# Patient Record
Sex: Female | Born: 1975 | Race: White | Hispanic: No | Marital: Married | Smoking: Never smoker
Health system: Southern US, Community
[De-identification: ages and names within clinical notes are randomized; demographics above are authoritative.]

## PROBLEM LIST (undated history)

## (undated) DIAGNOSIS — E119 Type 2 diabetes mellitus without complications: Secondary | ICD-10-CM

## (undated) DIAGNOSIS — N6310 Unspecified lump in the right breast, unspecified quadrant: Secondary | ICD-10-CM

## (undated) HISTORY — PX: SPLENECTOMY: SUR1306

---

## 2006-09-28 ENCOUNTER — Other Ambulatory Visit: Admission: RE | Admit: 2006-09-28 | Discharge: 2006-09-28 | Payer: Self-pay | Admitting: Obstetrics & Gynecology

## 2006-10-03 ENCOUNTER — Encounter: Admission: RE | Admit: 2006-10-03 | Discharge: 2006-10-03 | Payer: Self-pay | Admitting: Obstetrics & Gynecology

## 2014-02-12 ENCOUNTER — Other Ambulatory Visit (HOSPITAL_COMMUNITY)
Admission: RE | Admit: 2014-02-12 | Discharge: 2014-02-12 | Disposition: A | Discharge status: Home / Self Care | Payer: BC Managed Care – PPO | Source: Ambulatory Visit | Attending: Obstetrics & Gynecology | Admitting: Obstetrics & Gynecology

## 2014-02-12 ENCOUNTER — Other Ambulatory Visit: Payer: Self-pay | Admitting: Obstetrics & Gynecology

## 2014-02-12 DIAGNOSIS — Z124 Encounter for screening for malignant neoplasm of cervix: Secondary | ICD-10-CM | POA: Insufficient documentation

## 2014-02-12 DIAGNOSIS — Z1151 Encounter for screening for human papillomavirus (HPV): Secondary | ICD-10-CM | POA: Insufficient documentation

## 2016-06-02 ENCOUNTER — Other Ambulatory Visit (HOSPITAL_COMMUNITY)
Admission: RE | Admit: 2016-06-02 | Discharge: 2016-06-02 | Disposition: A | Discharge status: Home / Self Care | Payer: BC Managed Care – PPO | Source: Ambulatory Visit | Attending: Obstetrics and Gynecology | Admitting: Obstetrics and Gynecology

## 2016-06-02 ENCOUNTER — Other Ambulatory Visit: Payer: Self-pay | Admitting: Obstetrics & Gynecology

## 2016-06-02 DIAGNOSIS — Z01411 Encounter for gynecological examination (general) (routine) with abnormal findings: Secondary | ICD-10-CM | POA: Diagnosis present

## 2016-06-03 LAB — CYTOLOGY - PAP

## 2016-10-31 HISTORY — PX: BREAST EXCISIONAL BIOPSY: SUR124

## 2017-04-10 ENCOUNTER — Other Ambulatory Visit: Payer: Self-pay | Admitting: Obstetrics & Gynecology

## 2017-04-10 DIAGNOSIS — N63 Unspecified lump in unspecified breast: Secondary | ICD-10-CM

## 2017-05-18 ENCOUNTER — Ambulatory Visit
Admission: RE | Admit: 2017-05-18 | Discharge: 2017-05-18 | Disposition: A | Discharge status: Home / Self Care | Payer: BC Managed Care – PPO | Source: Ambulatory Visit | Attending: Obstetrics & Gynecology | Admitting: Obstetrics & Gynecology

## 2017-05-18 ENCOUNTER — Other Ambulatory Visit: Payer: Self-pay | Admitting: Obstetrics & Gynecology

## 2017-05-18 DIAGNOSIS — N63 Unspecified lump in unspecified breast: Secondary | ICD-10-CM

## 2017-05-22 ENCOUNTER — Other Ambulatory Visit: Payer: Self-pay | Admitting: Obstetrics & Gynecology

## 2017-05-22 DIAGNOSIS — N63 Unspecified lump in unspecified breast: Secondary | ICD-10-CM

## 2017-05-23 ENCOUNTER — Ambulatory Visit
Admission: RE | Admit: 2017-05-23 | Discharge: 2017-05-23 | Disposition: A | Discharge status: Home / Self Care | Payer: BC Managed Care – PPO | Source: Ambulatory Visit | Attending: Obstetrics & Gynecology | Admitting: Obstetrics & Gynecology

## 2017-05-23 DIAGNOSIS — N63 Unspecified lump in unspecified breast: Secondary | ICD-10-CM

## 2017-06-20 ENCOUNTER — Ambulatory Visit: Payer: Self-pay | Admitting: General Surgery

## 2017-06-20 DIAGNOSIS — N6021 Fibroadenosis of right breast: Secondary | ICD-10-CM

## 2017-07-10 ENCOUNTER — Ambulatory Visit: Payer: Self-pay | Admitting: General Surgery

## 2017-07-10 DIAGNOSIS — N6021 Fibroadenosis of right breast: Secondary | ICD-10-CM

## 2017-07-17 ENCOUNTER — Other Ambulatory Visit: Payer: Self-pay | Admitting: General Surgery

## 2017-07-17 DIAGNOSIS — N6021 Fibroadenosis of right breast: Secondary | ICD-10-CM

## 2017-09-12 ENCOUNTER — Encounter (HOSPITAL_BASED_OUTPATIENT_CLINIC_OR_DEPARTMENT_OTHER): Payer: Self-pay | Admitting: *Deleted

## 2017-09-12 ENCOUNTER — Other Ambulatory Visit: Payer: Self-pay

## 2017-09-13 ENCOUNTER — Encounter (HOSPITAL_BASED_OUTPATIENT_CLINIC_OR_DEPARTMENT_OTHER)
Admission: RE | Admit: 2017-09-13 | Discharge: 2017-09-13 | Disposition: A | Discharge status: Home / Self Care | Payer: BC Managed Care – PPO | Source: Ambulatory Visit | Attending: General Surgery | Admitting: General Surgery

## 2017-09-13 DIAGNOSIS — Z01818 Encounter for other preprocedural examination: Secondary | ICD-10-CM | POA: Insufficient documentation

## 2017-09-13 LAB — BASIC METABOLIC PANEL
ANION GAP: 7 (ref 5–15)
BUN: 10 mg/dL (ref 6–20)
CALCIUM: 9 mg/dL (ref 8.9–10.3)
CO2: 24 mmol/L (ref 22–32)
Chloride: 103 mmol/L (ref 101–111)
Creatinine, Ser: 0.79 mg/dL (ref 0.44–1.00)
GFR calc non Af Amer: >60 mL/min (ref >60)
Glucose, Bld: 234 mg/dL — ABNORMAL HIGH (ref 65–99)
Potassium: 4.4 mmol/L (ref 3.5–5.1)
Sodium: 134 mmol/L — ABNORMAL LOW (ref 135–145)

## 2017-09-18 ENCOUNTER — Ambulatory Visit
Admission: RE | Admit: 2017-09-18 | Discharge: 2017-09-18 | Disposition: A | Discharge status: Home / Self Care | Payer: BC Managed Care – PPO | Source: Ambulatory Visit | Attending: General Surgery | Admitting: General Surgery

## 2017-09-18 DIAGNOSIS — N6021 Fibroadenosis of right breast: Secondary | ICD-10-CM

## 2017-09-19 ENCOUNTER — Ambulatory Visit
Admission: RE | Admit: 2017-09-19 | Discharge: 2017-09-19 | Disposition: A | Discharge status: Home / Self Care | Payer: BC Managed Care – PPO | Source: Ambulatory Visit | Attending: General Surgery | Admitting: General Surgery

## 2017-09-19 ENCOUNTER — Encounter (HOSPITAL_BASED_OUTPATIENT_CLINIC_OR_DEPARTMENT_OTHER): Payer: Self-pay | Admitting: *Deleted

## 2017-09-19 ENCOUNTER — Ambulatory Visit (HOSPITAL_BASED_OUTPATIENT_CLINIC_OR_DEPARTMENT_OTHER): Payer: BC Managed Care – PPO | Admitting: Anesthesiology

## 2017-09-19 ENCOUNTER — Encounter (HOSPITAL_BASED_OUTPATIENT_CLINIC_OR_DEPARTMENT_OTHER)
Admission: RE | Disposition: A | Discharge status: Home / Self Care | Payer: Self-pay | Source: Ambulatory Visit | Attending: General Surgery

## 2017-09-19 ENCOUNTER — Other Ambulatory Visit: Payer: Self-pay

## 2017-09-19 ENCOUNTER — Ambulatory Visit (HOSPITAL_BASED_OUTPATIENT_CLINIC_OR_DEPARTMENT_OTHER)
Admission: RE | Admit: 2017-09-19 | Discharge: 2017-09-19 | Disposition: A | Discharge status: Home / Self Care | Payer: BC Managed Care – PPO | Source: Ambulatory Visit | Attending: General Surgery | Admitting: General Surgery

## 2017-09-19 DIAGNOSIS — N6031 Fibrosclerosis of right breast: Secondary | ICD-10-CM | POA: Insufficient documentation

## 2017-09-19 DIAGNOSIS — N6091 Unspecified benign mammary dysplasia of right breast: Secondary | ICD-10-CM | POA: Insufficient documentation

## 2017-09-19 DIAGNOSIS — Z79899 Other long term (current) drug therapy: Secondary | ICD-10-CM | POA: Diagnosis not present

## 2017-09-19 DIAGNOSIS — E119 Type 2 diabetes mellitus without complications: Secondary | ICD-10-CM | POA: Diagnosis not present

## 2017-09-19 DIAGNOSIS — Z7984 Long term (current) use of oral hypoglycemic drugs: Secondary | ICD-10-CM | POA: Diagnosis not present

## 2017-09-19 DIAGNOSIS — N6021 Fibroadenosis of right breast: Secondary | ICD-10-CM

## 2017-09-19 HISTORY — PX: BREAST LUMPECTOMY WITH RADIOACTIVE SEED LOCALIZATION: SHX6424

## 2017-09-19 HISTORY — DX: Type 2 diabetes mellitus without complications: E11.9

## 2017-09-19 HISTORY — DX: Unspecified lump in the right breast, unspecified quadrant: N63.10

## 2017-09-19 LAB — GLUCOSE, CAPILLARY
GLUCOSE-CAPILLARY: 214 mg/dL — AB (ref 65–99)
Glucose-Capillary: 152 mg/dL — ABNORMAL HIGH (ref 65–99)

## 2017-09-19 SURGERY — BREAST LUMPECTOMY WITH RADIOACTIVE SEED LOCALIZATION
Anesthesia: General | Site: Breast | Laterality: Right

## 2017-09-19 MED ORDER — BUPIVACAINE HCL (PF) 0.25 % IJ SOLN
INTRAMUSCULAR | Status: AC
  Filled 2017-09-19: qty 30

## 2017-09-19 MED ORDER — PROPOFOL 10 MG/ML IV BOLUS
INTRAVENOUS | Status: DC | PRN
  Administered 2017-09-19: 180 mg via INTRAVENOUS

## 2017-09-19 MED ORDER — PHENYLEPHRINE 40 MCG/ML (10ML) SYRINGE FOR IV PUSH (FOR BLOOD PRESSURE SUPPORT)
PREFILLED_SYRINGE | INTRAVENOUS | Status: DC | PRN
  Administered 2017-09-19: 80 ug via INTRAVENOUS

## 2017-09-19 MED ORDER — CELECOXIB 400 MG PO CAPS
400.0000 mg | ORAL_CAPSULE | ORAL | Status: AC
  Administered 2017-09-19: 400 mg via ORAL

## 2017-09-19 MED ORDER — OXYCODONE HCL 5 MG PO TABS: 5.0000 mg | ORAL_TABLET | Freq: Once | ORAL | Status: DC | PRN

## 2017-09-19 MED ORDER — HYDROCODONE-ACETAMINOPHEN 5-325 MG PO TABS: 1.0000 | ORAL_TABLET | Freq: Four times a day (QID) | ORAL | 0 refills | Status: AC | PRN

## 2017-09-19 MED ORDER — GABAPENTIN 300 MG PO CAPS
300.0000 mg | ORAL_CAPSULE | ORAL | Status: AC
  Administered 2017-09-19: 300 mg via ORAL

## 2017-09-19 MED ORDER — DEXAMETHASONE SODIUM PHOSPHATE 10 MG/ML IJ SOLN
INTRAMUSCULAR | Status: DC | PRN
  Administered 2017-09-19: 10 mg via INTRAVENOUS

## 2017-09-19 MED ORDER — CHLORHEXIDINE GLUCONATE CLOTH 2 % EX PADS: 6.0000 | MEDICATED_PAD | Freq: Once | CUTANEOUS | Status: DC

## 2017-09-19 MED ORDER — GABAPENTIN 300 MG PO CAPS
ORAL_CAPSULE | ORAL | Status: AC
  Filled 2017-09-19: qty 1

## 2017-09-19 MED ORDER — MIDAZOLAM HCL 2 MG/2ML IJ SOLN
INTRAMUSCULAR | Status: AC
  Filled 2017-09-19: qty 2

## 2017-09-19 MED ORDER — PROPOFOL 10 MG/ML IV BOLUS
INTRAVENOUS | Status: AC
  Filled 2017-09-19: qty 20

## 2017-09-19 MED ORDER — LACTATED RINGERS IV SOLN
INTRAVENOUS | Status: DC
  Administered 2017-09-19 (×2): via INTRAVENOUS

## 2017-09-19 MED ORDER — LIDOCAINE 2% (20 MG/ML) 5 ML SYRINGE
INTRAMUSCULAR | Status: AC
  Filled 2017-09-19: qty 5

## 2017-09-19 MED ORDER — PROMETHAZINE HCL 25 MG/ML IJ SOLN: 6.2500 mg | INTRAMUSCULAR | Status: DC | PRN

## 2017-09-19 MED ORDER — BUPIVACAINE-EPINEPHRINE (PF) 0.25% -1:200000 IJ SOLN
INTRAMUSCULAR | Status: AC
  Filled 2017-09-19: qty 30

## 2017-09-19 MED ORDER — MIDAZOLAM HCL 2 MG/2ML IJ SOLN
1.0000 mg | INTRAMUSCULAR | Status: DC | PRN
  Administered 2017-09-19: 2 mg via INTRAVENOUS

## 2017-09-19 MED ORDER — OXYCODONE HCL 5 MG/5ML PO SOLN: 5.0000 mg | Freq: Once | ORAL | Status: DC | PRN

## 2017-09-19 MED ORDER — SCOPOLAMINE 1 MG/3DAYS TD PT72: 1.0000 | MEDICATED_PATCH | Freq: Once | TRANSDERMAL | Status: DC | PRN

## 2017-09-19 MED ORDER — FENTANYL CITRATE (PF) 100 MCG/2ML IJ SOLN
INTRAMUSCULAR | Status: AC
  Filled 2017-09-19: qty 2

## 2017-09-19 MED ORDER — CEFAZOLIN SODIUM-DEXTROSE 2-4 GM/100ML-% IV SOLN
2.0000 g | INTRAVENOUS | Status: AC
  Administered 2017-09-19: 2 g via INTRAVENOUS

## 2017-09-19 MED ORDER — LIDOCAINE 2% (20 MG/ML) 5 ML SYRINGE
INTRAMUSCULAR | Status: DC | PRN
  Administered 2017-09-19: 80 mg via INTRAVENOUS

## 2017-09-19 MED ORDER — ACETAMINOPHEN 500 MG PO TABS
1000.0000 mg | ORAL_TABLET | ORAL | Status: AC
  Administered 2017-09-19: 1000 mg via ORAL

## 2017-09-19 MED ORDER — HYDROMORPHONE HCL 1 MG/ML IJ SOLN
0.2500 mg | INTRAMUSCULAR | Status: DC | PRN
  Administered 2017-09-19: 0.5 mg via INTRAVENOUS

## 2017-09-19 MED ORDER — FENTANYL CITRATE (PF) 100 MCG/2ML IJ SOLN
50.0000 ug | INTRAMUSCULAR | Status: DC | PRN
  Administered 2017-09-19: 100 ug via INTRAVENOUS

## 2017-09-19 MED ORDER — MECLIZINE HCL 25 MG PO TABS
25.0000 mg | ORAL_TABLET | Freq: Two times a day (BID) | ORAL | Status: DC | PRN
  Administered 2017-09-19: 25 mg via ORAL
  Filled 2017-09-19: qty 1

## 2017-09-19 MED ORDER — ONDANSETRON HCL 4 MG/2ML IJ SOLN
INTRAMUSCULAR | Status: DC | PRN
  Administered 2017-09-19: 4 mg via INTRAVENOUS

## 2017-09-19 MED ORDER — ONDANSETRON HCL 4 MG/2ML IJ SOLN
INTRAMUSCULAR | Status: AC
  Filled 2017-09-19: qty 2

## 2017-09-19 MED ORDER — BUPIVACAINE-EPINEPHRINE (PF) 0.25% -1:200000 IJ SOLN
INTRAMUSCULAR | Status: DC | PRN
  Administered 2017-09-19 (×2): 10 mL via PERINEURAL

## 2017-09-19 MED ORDER — ACETAMINOPHEN 500 MG PO TABS
ORAL_TABLET | ORAL | Status: AC
  Filled 2017-09-19: qty 2

## 2017-09-19 MED ORDER — CEFAZOLIN SODIUM-DEXTROSE 2-4 GM/100ML-% IV SOLN
INTRAVENOUS | Status: AC
  Filled 2017-09-19: qty 100

## 2017-09-19 MED ORDER — KETOROLAC TROMETHAMINE 30 MG/ML IJ SOLN: 30.0000 mg | Freq: Once | INTRAMUSCULAR | Status: DC | PRN

## 2017-09-19 MED ORDER — HYDROMORPHONE HCL 1 MG/ML IJ SOLN
INTRAMUSCULAR | Status: AC
  Filled 2017-09-19: qty 0.5

## 2017-09-19 MED ORDER — CELECOXIB 200 MG PO CAPS
ORAL_CAPSULE | ORAL | Status: AC
  Filled 2017-09-19: qty 2

## 2017-09-19 SURGICAL SUPPLY — 45 items
ADH SKN CLS APL DERMABOND .7 (GAUZE/BANDAGES/DRESSINGS) ×1
APPLIER CLIP 9.375 MED OPEN (MISCELLANEOUS)
APR CLP MED 9.3 20 MLT OPN (MISCELLANEOUS)
BLADE SURG 15 STRL LF DISP TIS (BLADE) ×1 IMPLANT
BLADE SURG 15 STRL SS (BLADE) ×2
CANISTER SUC SOCK COL 7IN (MISCELLANEOUS) ×1 IMPLANT
CANISTER SUCT 1200ML W/VALVE (MISCELLANEOUS) ×2 IMPLANT
CHLORAPREP W/TINT 26ML (MISCELLANEOUS) ×2 IMPLANT
CLIP APPLIE 9.375 MED OPEN (MISCELLANEOUS) IMPLANT
COVER BACK TABLE 60X90IN (DRAPES) ×2 IMPLANT
COVER MAYO STAND STRL (DRAPES) ×2 IMPLANT
COVER PROBE W GEL 5X96 (DRAPES) ×2 IMPLANT
DECANTER SPIKE VIAL GLASS SM (MISCELLANEOUS) IMPLANT
DERMABOND ADVANCED (GAUZE/BANDAGES/DRESSINGS) ×1
DERMABOND ADVANCED .7 DNX12 (GAUZE/BANDAGES/DRESSINGS) ×1 IMPLANT
DEVICE DUBIN W/COMP PLATE 8390 (MISCELLANEOUS) ×2 IMPLANT
DRAPE LAPAROSCOPIC ABDOMINAL (DRAPES) ×2 IMPLANT
DRAPE UTILITY XL STRL (DRAPES) ×2 IMPLANT
ELECT COATED BLADE 2.86 ST (ELECTRODE) ×2 IMPLANT
ELECT REM PT RETURN 9FT ADLT (ELECTROSURGICAL) ×2
ELECTRODE REM PT RTRN 9FT ADLT (ELECTROSURGICAL) ×1 IMPLANT
GLOVE BIO SURGEON STRL SZ7.5 (GLOVE) ×4 IMPLANT
GLOVE BIOGEL PI IND STRL 7.0 (GLOVE) IMPLANT
GLOVE BIOGEL PI INDICATOR 7.0 (GLOVE) ×2
GLOVE ECLIPSE 6.5 STRL STRAW (GLOVE) ×1 IMPLANT
GOWN STRL REUS W/ TWL LRG LVL3 (GOWN DISPOSABLE) ×2 IMPLANT
GOWN STRL REUS W/TWL LRG LVL3 (GOWN DISPOSABLE) ×4
ILLUMINATOR WAVEGUIDE N/F (MISCELLANEOUS) IMPLANT
KIT MARKER MARGIN INK (KITS) ×2 IMPLANT
LIGHT WAVEGUIDE WIDE FLAT (MISCELLANEOUS) ×1 IMPLANT
NDL HYPO 25X1 1.5 SAFETY (NEEDLE) IMPLANT
NEEDLE HYPO 25X1 1.5 SAFETY (NEEDLE) IMPLANT
NS IRRIG 1000ML POUR BTL (IV SOLUTION) ×1 IMPLANT
PACK BASIN DAY SURGERY FS (CUSTOM PROCEDURE TRAY) ×2 IMPLANT
PENCIL BUTTON HOLSTER BLD 10FT (ELECTRODE) ×2 IMPLANT
SLEEVE SCD COMPRESS KNEE MED (MISCELLANEOUS) ×2 IMPLANT
SPONGE LAP 18X18 X RAY DECT (DISPOSABLE) ×2 IMPLANT
SUT MON AB 4-0 PC3 18 (SUTURE) ×1 IMPLANT
SUT SILK 2 0 SH (SUTURE) IMPLANT
SUT VICRYL 3-0 CR8 SH (SUTURE) ×2 IMPLANT
SYR CONTROL 10ML LL (SYRINGE) IMPLANT
TOWEL OR 17X24 6PK STRL BLUE (TOWEL DISPOSABLE) ×2 IMPLANT
TOWEL OR NON WOVEN STRL DISP B (DISPOSABLE) ×1 IMPLANT
TUBE CONNECTING 20X1/4 (TUBING) ×2 IMPLANT
YANKAUER SUCT BULB TIP NO VENT (SUCTIONS) ×1 IMPLANT

## 2017-09-19 NOTE — Transfer of Care (Signed)
Immediate Anesthesia Transfer of Care Note  Patient: Elizabeth Houston  Procedure(s) Performed: RIGHT BREAST LUMPECTOMY WITH RADIOACTIVE SEED LOCALIZATION ERAS PATHWAY (Right Breast)  Patient Location: PACU  Anesthesia Type:General  Level of Consciousness: awake, sedated and patient cooperative  Airway & Oxygen Therapy: Patient Spontanous Breathing  Post-op Assessment: Report given to RN and Post -op Vital signs reviewed and stable  Post vital signs: Reviewed and stable  Last Vitals:  Vitals:   09/19/17 1202  BP: 134/83  Pulse: 80  Resp: 18  Temp: 36.9 C  SpO2: 99%    Last Pain:  Vitals:   09/19/17 1202  TempSrc: Oral      Patients Stated Pain Goal: 0 (09/19/17 1202)  Complications: No apparent anesthesia complications

## 2017-09-19 NOTE — Anesthesia Procedure Notes (Addendum)
Procedure Name: LMA Insertion Date/Time: 09/19/2017 1:28 PM Performed by: Gar GibbonKeeton, Leomia Blake S, CRNA Pre-anesthesia Checklist: Patient identified, Emergency Drugs available, Suction available and Patient being monitored Patient Re-evaluated:Patient Re-evaluated prior to induction Oxygen Delivery Method: Circle system utilized Preoxygenation: Pre-oxygenation with 100% oxygen Induction Type: IV induction Ventilation: Mask ventilation without difficulty LMA: LMA inserted LMA Size: 4.0 Number of attempts: 1 Airway Equipment and Method: Bite block Placement Confirmation: positive ETCO2 Tube secured with: Tape Dental Injury: Teeth and Oropharynx as per pre-operative assessment

## 2017-09-19 NOTE — Anesthesia Preprocedure Evaluation (Signed)
Anesthesia Evaluation  Patient identified by MRN, date of birth, ID band Patient awake    Reviewed: Allergy & Precautions, NPO status , Patient's Chart, lab work & pertinent test results  Airway Mallampati: II  TM Distance: >3 FB Neck ROM: Full    Dental no notable dental hx.    Pulmonary neg pulmonary ROS,    Pulmonary exam normal breath sounds clear to auscultation       Cardiovascular negative cardio ROS Normal cardiovascular exam Rhythm:Regular Rate:Normal     Neuro/Psych negative neurological ROS  negative psych ROS   GI/Hepatic negative GI ROS, Neg liver ROS,   Endo/Other  diabetes  Renal/GU negative Renal ROS  negative genitourinary   Musculoskeletal negative musculoskeletal ROS (+)   Abdominal   Peds negative pediatric ROS (+)  Hematology negative hematology ROS (+)   Anesthesia Other Findings   Reproductive/Obstetrics negative OB ROS                             Anesthesia Physical Anesthesia Plan  ASA: II  Anesthesia Plan: General   Post-op Pain Management:    Induction: Intravenous  PONV Risk Score and Plan: 2 and Ondansetron, Treatment may vary due to age or medical condition and Midazolam  Airway Management Planned: LMA  Additional Equipment:   Intra-op Plan:   Post-operative Plan:   Informed Consent: I have reviewed the patients History and Physical, chart, labs and discussed the procedure including the risks, benefits and alternatives for the proposed anesthesia with the patient or authorized representative who has indicated his/her understanding and acceptance.   Dental advisory given  Plan Discussed with: CRNA and Surgeon  Anesthesia Plan Comments:         Anesthesia Quick Evaluation

## 2017-09-19 NOTE — Interval H&P Note (Signed)
History and Physical Interval Note:  09/19/2017 1:00 PM  Elizabeth FaesSharon A Reller  has presented today for surgery, with the diagnosis of right breast sclerosing lesion  The various methods of treatment have been discussed with the patient and family. After consideration of risks, benefits and other options for treatment, the patient has consented to  Procedure(s): RIGHT BREAST LUMPECTOMY WITH RADIOACTIVE SEED LOCALIZATION ERAS PATHWAY (Right) as a surgical intervention .  The patient's history has been reviewed, patient examined, no change in status, stable for surgery.  I have reviewed the patient's chart and labs.  Questions were answered to the patient's satisfaction.     TOTH III,Muneeb Veras S

## 2017-09-19 NOTE — H&P (Signed)
Elizabeth Houston  Location: [facility] Patient #: [id number] DOB: 08-14-1976 Married / Language: English / Race: White Female   History of Present Illness The patient is a 41 year old female who presents with a breast mass. We're asked to see the patient in consultation by Dr. Michiel Cowboy to evaluate her for a right breast complex sclerosing lesion. The patient is a 41 year old white female who has had this area of distortion in the lower outer right breast for about the last 10-11 years. She denies any breast pain or discharge from the nipple. She has no family history of breast cancer. On her most recent mammogram this area of distortion changed. The area of distortion is also associated with a cluster of small cysts. The entire area measures about 2.5 cm.   Past Surgical History  Breast Biopsy  Right. Splenectomy   Diagnostic Studies History  Colonoscopy  never Mammogram  within last year Pap Smear  1-5 years ago  Allergies  No Known Drug Allergies Allergies Reconciled   Medication History  MetFORMIN HCl ER ('500MG'$  Tablet ER 24HR, Oral) Active. OneTouch Verio [employer] (w/Device Kit,) Active. OneTouch Verio (In Vitro) Active. Meloxicam ('15MG'$  Tablet, Oral) Active. TiZANidine HCl ('4MG'$  Tablet, Oral) Active. Medications Reconciled  Social History  Caffeine use  Carbonated beverages, Tea. No alcohol use  No drug use  Tobacco use  Never smoker.  Family History  Diabetes Mellitus  Family Members In General. Hypertension  Father, Mother. Kidney Disease  Father.  Pregnancy / Birth History  Age at menarche  49 years. Gravida  2 Maternal age  51-25 Para  2 Regular periods   Other Problems  Diabetes Mellitus  Lump In Breast     Review of Systems  General Not Present- Appetite Loss, Chills, Fatigue, Fever, Night Sweats, Weight Gain and Weight Loss. Skin Not Present- Change in Wart/Mole, Dryness, Hives, Jaundice, New  Lesions, Non-Healing Wounds, Rash and Ulcer. HEENT Not Present- Earache, Hearing Loss, Hoarseness, Nose Bleed, Oral Ulcers, Ringing in the Ears, Seasonal Allergies, Sinus Pain, Sore Throat, Visual Disturbances, Wears glasses/contact lenses and Yellow Eyes. Respiratory Not Present- Bloody sputum, Chronic Cough, Difficulty Breathing, Snoring and Wheezing. Breast Present- Breast Mass. Not Present- Breast Pain, Nipple Discharge and Skin Changes. Cardiovascular Not Present- Chest Pain, Difficulty Breathing Lying Down, Leg Cramps, Palpitations, Rapid Heart Rate, Shortness of Breath and Swelling of Extremities. Gastrointestinal Not Present- Abdominal Pain, Bloating, Bloody Stool, Change in Bowel Habits, Chronic diarrhea, Constipation, Difficulty Swallowing, Excessive gas, Gets full quickly at meals, Hemorrhoids, Indigestion, Nausea, Rectal Pain and Vomiting. Female Genitourinary Not Present- Frequency, Nocturia, Painful Urination, Pelvic Pain and Urgency. Musculoskeletal Not Present- Back Pain, Joint Pain, Joint Stiffness, Muscle Pain, Muscle Weakness and Swelling of Extremities. Neurological Not Present- Decreased Memory, Fainting, Headaches, Numbness, Seizures, Tingling, Tremor, Trouble walking and Weakness. Psychiatric Not Present- Anxiety, Bipolar, Change in Sleep Pattern, Depression, Fearful and Frequent crying. Endocrine Not Present- Cold Intolerance, Excessive Hunger, Hair Changes, Heat Intolerance, Hot flashes and New Diabetes. Hematology Not Present- Blood Thinners, Easy Bruising, Excessive bleeding, Gland problems, HIV and Persistent Infections.  Vitals Weight: 205.8 lb Height: 65in Body Surface Area: 2 m Body Mass Index: 34.25 kg/m  Temp.: 97.48F  Pulse: 100 (Regular)  P.OX: 98% (Room air) BP: 132/84 (Sitting, Left Arm, Standard)       Physical Exam General Mental Status-Alert. General Appearance-Consistent with stated age. Hydration-Well  hydrated. Voice-Normal.  Head and Neck Head-normocephalic, atraumatic with no lesions or palpable masses. Trachea-midline. Thyroid Gland Characteristics -  normal size and consistency.  Eye Eyeball - Bilateral-Extraocular movements intact. Sclera/Conjunctiva - Bilateral-No scleral icterus.  Chest and Lung Exam Chest and lung exam reveals -quiet, even and easy respiratory effort with no use of accessory muscles and on auscultation, normal breath sounds, no adventitious sounds and normal vocal resonance. Inspection Chest Wall - Normal. Back - normal.  Breast Note: There is some palpable dense breast tissue that is symmetric bilaterally. There is no discrete palpable mass in either breast. There is no palpable axillary, supraclavicular, or cervical lymphadenopathy.   Cardiovascular Cardiovascular examination reveals -normal heart sounds, regular rate and rhythm with no murmurs and normal pedal pulses bilaterally.  Abdomen Inspection Inspection of the abdomen reveals - No Hernias. Skin - Scar - no surgical scars. Palpation/Percussion Palpation and Percussion of the abdomen reveal - Soft, Non Tender, No Rebound tenderness, No Rigidity (guarding) and No hepatosplenomegaly. Auscultation Auscultation of the abdomen reveals - Bowel sounds normal.  Neurologic Neurologic evaluation reveals -alert and oriented x 3 with no impairment of recent or remote memory. Mental Status-Normal.  Musculoskeletal Normal Exam - Left-Upper Extremity Strength Normal and Lower Extremity Strength Normal. Normal Exam - Right-Upper Extremity Strength Normal and Lower Extremity Strength Normal.  Lymphatic Head & Neck  General Head & Neck Lymphatics: Bilateral - Description - Normal. Axillary  General Axillary Region: Bilateral - Description - Normal. Tenderness - Non Tender. Femoral & Inguinal  Generalized Femoral & Inguinal Lymphatics: Bilateral - Description - Normal.  Tenderness - Non Tender.    Assessment & Plan  SCLEROSING ADENOSIS OF BREAST, RIGHT (N60.21) Impression: The patient appears to have a complex sclerosing lesion in the lower outer right breast in an area of distortion measures about 2.5 cm. Because of its appearance and because of its size I think it would be reasonable to remove the area to make sure that we are not missing anything more significant. I have discussed with her in detail the risks and benefits of the operation as well as some of the technical aspects and she understands and wishes to proceed. I will plan for a right breast radioactive seed localized lumpectomy. Current Plans Pt Education - Breast Diseases: discussed with patient and provided information.

## 2017-09-19 NOTE — Discharge Instructions (Signed)

## 2017-09-19 NOTE — Anesthesia Postprocedure Evaluation (Signed)
Anesthesia Post Note  Patient: Elizabeth AlbertsSharon A Ferrara  Procedure(s) Performed: RIGHT BREAST LUMPECTOMY WITH RADIOACTIVE SEED LOCALIZATION ERAS PATHWAY (Right Breast)     Patient location during evaluation: PACU Anesthesia Type: General Level of consciousness: awake and alert Pain management: pain level controlled Vital Signs Assessment: post-procedure vital signs reviewed and stable Respiratory status: spontaneous breathing, nonlabored ventilation, respiratory function stable and patient connected to nasal cannula oxygen Cardiovascular status: blood pressure returned to baseline and stable Postop Assessment: no apparent nausea or vomiting Anesthetic complications: no    Last Vitals:  Vitals:   09/19/17 1500 09/19/17 1515  BP: (!) 124/7 129/77  Pulse: 77 76  Resp: 12 10  Temp:    SpO2: 99% 99%    Last Pain:  Vitals:   09/19/17 1515  TempSrc:   PainSc: 2                  Vernestine Brodhead S

## 2017-09-19 NOTE — Op Note (Signed)
09/19/2017  2:18 PM  PATIENT:  Elizabeth Houston  41 y.o. female  PRE-OPERATIVE DIAGNOSIS:  right breast sclerosing lesion  POST-OPERATIVE DIAGNOSIS:  right breast sclerosing lesion  PROCEDURE:  Procedure(s): RIGHT BREAST LUMPECTOMY WITH RADIOACTIVE SEED LOCALIZATION ERAS PATHWAY (Right)  SURGEON:  Surgeon(s) and Role:    * Griselda Mineroth, Denim Start III, MD - Primary  PHYSICIAN ASSISTANT:   ASSISTANTS: none   ANESTHESIA:   local and general  EBL:  minimal   BLOOD ADMINISTERED:none  DRAINS: none   LOCAL MEDICATIONS USED:  MARCAINE     SPECIMEN:  Source of Specimen:  right breast tissue  DISPOSITION OF SPECIMEN:  PATHOLOGY  COUNTS:  YES  TOURNIQUET:  * No tourniquets in log *  DICTATION: .Dragon Dictation   After informed consent was obtained the patient was brought to the operating room and placed in the supine position on the operating table.  After adequate induction of general anesthesia the patient's right breast was prepped with ChloraPrep, allowed to dry, and draped in the usual sterile manner.  An appropriate timeout was performed.  Previously an I-125 seed was placed in the lower outer aspect of the right breast to mark an area of a complex sclerosing lesion.  The neoprobe was sent to I-125 in the area of radioactivity was readily identified.  The area around this was infiltrated with quarter percent Marcaine.  A curvilinear incision was made along the inframammary fold in the lower outer right breast with a 15 blade knife.  The incision was carried through the skin and subcutaneous tissue sharply with electrocautery.  Dissection was then carried towards the radioactive seed under the direction of the neoprobe.  Once I approached the seed I then excised a circular portion of breast tissue around the radioactive seed will check in the area of radioactivity frequently.  Once the specimen was removed it was oriented with the appropriate paint colors.  A specimen radiograph was obtained that  showed the clip and seed to be near the center of the specimen.  Specimen was then sent to pathology for further evaluation.  The wound was irrigated with saline and infiltrated with more quarter percent Marcaine.  Hemostasis was achieved using the Bovie electrocautery.  The deep layer of the wound was then closed with layers of interrupted 3-0 Vicryl stitches.  The skin was then closed with a running 4-0 Monocryl subcuticular stitch.  Dermabond dressings were applied.  The patient tolerated the procedure well.  At the end of the case all needle sponge and instrument counts were correct.  The patient was then awakened and taken to recovery in stable condition.  PLAN OF CARE: Discharge to home after PACU  PATIENT DISPOSITION:  PACU - hemodynamically stable.   Delay start of Pharmacological VTE agent (>24hrs) due to surgical blood loss or risk of bleeding: not applicable

## 2017-09-20 ENCOUNTER — Encounter (HOSPITAL_BASED_OUTPATIENT_CLINIC_OR_DEPARTMENT_OTHER): Payer: Self-pay | Admitting: General Surgery

## 2017-11-01 ENCOUNTER — Encounter: Payer: BC Managed Care – PPO | Attending: Family Medicine | Admitting: Registered"

## 2017-11-01 ENCOUNTER — Encounter: Payer: Self-pay | Admitting: Registered"

## 2017-11-01 DIAGNOSIS — E1165 Type 2 diabetes mellitus with hyperglycemia: Secondary | ICD-10-CM | POA: Insufficient documentation

## 2017-11-01 DIAGNOSIS — Z713 Dietary counseling and surveillance: Secondary | ICD-10-CM | POA: Diagnosis not present

## 2017-11-01 DIAGNOSIS — E119 Type 2 diabetes mellitus without complications: Secondary | ICD-10-CM

## 2017-11-01 NOTE — Progress Notes (Signed)
Diabetes Self-Management Education  Visit Type: First/Initial  Appt. Start Time: 1400 Appt. End Time: 1530  11/01/2017  [name], identified by name and date of birth, is a 42 y.o. female with a diagnosis of Diabetes: Type 2.   ASSESSMENT Patient states she quite of few family members who have T2DM, but she has not had GDM with either of her 2 pregnancies.  Patient states she is following her doctor's instructions and taking all DM meds at night about 6-7 pm. Patient states she usually has dinner by 6 pm and goes to bed early.  Patient reports checking BG 2x day and the lowest numbers are in the morning 140-150. Pt states she has seen BG readings as high as 323 after lunch, but since the Dec 30th has been able to keep BG 186 or lower by really paying attention to what she is eating, however she feels she has cut back too much because she feels uncomfortably hungry. 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    Diabetes Self-Management Education - 11/01/17 1358      Visit Information   Visit Type  First/Initial      Initial Visit   Diabetes Type  Type 2    Are you currently following a meal plan?  Yes    What type of meal plan do you follow?  low carb/low sugar    Are you taking your medications as prescribed?  Yes glipizide, pioglitazone, metformin 1000 mg     Date Diagnosed  Feb 2018      Health Coping   How would you rate your overall health?  Good      Psychosocial Assessment   Patient Belief/Attitude about Diabetes  Motivated to manage diabetes    What is the last grade level you completed in school?  college      Complications   Last HgB A1C per patient/outside source  8.5 % per referral    Fasting Blood glucose range (mg/dL)  [id number]    Postprandial Blood glucose range (mg/dL)  811-914;>782180-200;>200 [phone]    Number of hypoglycemic episodes per month  0    Number of hyperglycemic episodes per week  7    Have you had a dilated eye exam in the past 12 months?  Yes    Have you had a dental exam in the past 12 months?  Yes    Are you checking your feet?  Yes    How many days per week are you checking your feet?  7      Dietary Intake   Breakfast  advocare meal replacement shakes (24cho / 24 pro)    Snack (morning)  fruit or none    Lunch  beans soup, salad OR chicken OR oatmeal with berries    Snack (afternoon)  none    Dinner  2 vegetables, chicken or pork, OR pasta sometimes with sausage, salad, bread OR 1/2 c brown rice, 1 c black-eyed peas, collard greens,     Snack (evening)  none OR cheese & pepperoni occassionally    Beverage(s)  water, 1/2 sweet/unsweet tea      Exercise   Exercise Type  Light (walking / raking leaves)    How many days per week to you exercise?  3    How many minutes per day do you exercise?  45    Total minutes per week of exercise  135      Patient Education   Previous Diabetes Education  No    Disease state   Factors that contribute to the development of diabetes    Nutrition management   Role of diet in the treatment of diabetes and the relationship between the three main macronutrients and blood glucose level;Carbohydrate counting;Food label reading, portion sizes and measuring food.;Reviewed blood glucose goals for pre and post meals and how to evaluate the patients' food intake on their blood glucose level.    Physical activity and exercise   Role of exercise on diabetes management, blood pressure control and cardiac health.    Medications  Reviewed patients medication for diabetes, action, purpose, timing of dose and side effects.    Monitoring  Identified appropriate SMBG and/or A1C goals.    Chronic complications  Relationship between chronic complications and blood glucose control    Psychosocial adjustment  Role of stress on diabetes      Individualized Goals (developed by patient)   Nutrition  General guidelines for healthy choices and portions  discussed    Physical Activity  Exercise 3-5 times per week    Monitoring   test blood glucose pre and post meals as discussed    Reducing Risk  increase portions of nuts and seeds      Outcomes   Expected Outcomes  Demonstrated interest in learning. Expect positive outcomes    Future DMSE  4-6 wks    Program Status  Completed     Individualized Plan for Diabetes Self-Management Training:   Learning Objective:  Patient will have a greater understanding of diabetes self-management. Patient education plan is to attend individual and/or group sessions per assessed needs and concerns.  Patient Instructions  Plan:  Aim for 2-4 Carb Choices per meal (30-60 grams)  Aim for 0-1 Carbs per snack if hungry  Include protein with your meals and snacks Consider reading food labels for Total Carbohydrate Continue your activity level as tolerated Continue checking BG at alternate times per day. Consider trying different times including before and after lunch. Aim to find meals that do not raise your blood sugar more than 40-50 pts.  Continue taking medication as directed by MD Consider making an appointment to have your A1c rechecked  Expected Outcomes:  Demonstrated interest in learning. Expect positive outcomes  Education material provided: Living Well with Diabetes, A1C conversion sheet, My Plate, Snack sheet and Carbohydrate counting sheet, medication list, list of endocrinologist in [place]  If problems or questions, patient to contact team via:  Phone  Future DSME appointment: 4-6 wks

## 2017-11-01 NOTE — Patient Instructions (Signed)
Plan:  Aim for 2-4 Carb Choices per meal (30-60 grams)  Aim for 0-1 Carbs per snack if hungry  Include protein with your meals and snacks Consider reading food labels for Total Carbohydrate Continue your activity level as tolerated Continue checking BG at alternate times per day. Consider trying different times including before and after lunch. Aim to find meals that do not raise your blood sugar more than 40-50 pts.  Continue taking medication as directed by MD Consider making an appointment to have your A1c rechecked

## 2018-06-06 ENCOUNTER — Other Ambulatory Visit: Payer: Self-pay | Admitting: Obstetrics & Gynecology

## 2018-06-06 DIAGNOSIS — Z1231 Encounter for screening mammogram for malignant neoplasm of breast: Secondary | ICD-10-CM

## 2018-06-15 ENCOUNTER — Ambulatory Visit
Admission: RE | Admit: 2018-06-15 | Discharge: 2018-06-15 | Disposition: A | Discharge status: Home / Self Care | Payer: BC Managed Care – PPO | Source: Ambulatory Visit | Attending: Obstetrics & Gynecology | Admitting: Obstetrics & Gynecology

## 2018-06-15 DIAGNOSIS — Z1231 Encounter for screening mammogram for malignant neoplasm of breast: Secondary | ICD-10-CM

## 2019-12-28 ENCOUNTER — Ambulatory Visit: Payer: BC Managed Care – PPO | Attending: Internal Medicine

## 2019-12-28 DIAGNOSIS — Z23 Encounter for immunization: Secondary | ICD-10-CM | POA: Insufficient documentation

## 2019-12-28 NOTE — Progress Notes (Signed)
   Covid-19 Vaccination Clinic  Name:  Elizabeth Houston    MRN: [id number] DOB: 01/09/1976  12/28/2019  Elizabeth Houston was observed post Covid-19 immunization for 15 minutes without incidence. She was provided with Vaccine Information Sheet and instruction to access the V-Safe system.   Elizabeth Houston was instructed to call 911 with any severe reactions post vaccine: Marland Kitchen Difficulty breathing  . Swelling of your face and throat  . A fast heartbeat  . A bad rash all over your body  . Dizziness and weakness    Immunizations Administered    Name Date Dose VIS Date Route   Pfizer COVID-19 Vaccine 12/28/2019 10:43 AM 0.3 mL 10/11/2019 Intramuscular   Manufacturer: [employer], [employer]   Lot: [id number]   NDC: [address]-0

## 2020-01-18 ENCOUNTER — Ambulatory Visit: Payer: BC Managed Care – PPO | Attending: Internal Medicine

## 2020-01-18 DIAGNOSIS — Z23 Encounter for immunization: Secondary | ICD-10-CM

## 2020-01-18 NOTE — Progress Notes (Signed)
   Covid-19 Vaccination Clinic  Name:  Elizabeth Houston    MRN: [id number] DOB: 08/28/1976  01/18/2020  [name] was observed post Covid-19 immunization for 15 minutes without incident. She was provided with Vaccine Information Sheet and instruction to access the V-Safe system.   [name] was instructed to call 911 with any severe reactions post vaccine: Marland Kitchen Difficulty breathing  . Swelling of face and throat  . A fast heartbeat  . A bad rash all over body  . Dizziness and weakness   Immunizations Administered    Name Date Dose VIS Date Route   Pfizer COVID-19 Vaccine 01/18/2020 10:29 AM 0.3 mL 10/11/2019 Intramuscular   Manufacturer: [employer], [employer]   Lot: [id number]   NDC: [address]-9

## 2020-01-22 ENCOUNTER — Ambulatory Visit: Payer: BC Managed Care – PPO

## 2021-07-26 ENCOUNTER — Other Ambulatory Visit: Payer: Self-pay | Admitting: Obstetrics & Gynecology

## 2021-07-26 DIAGNOSIS — Z1231 Encounter for screening mammogram for malignant neoplasm of breast: Secondary | ICD-10-CM

## 2021-08-30 ENCOUNTER — Ambulatory Visit
Admission: RE | Admit: 2021-08-30 | Discharge: 2021-08-30 | Disposition: A | Discharge status: Home / Self Care | Payer: BC Managed Care – PPO | Source: Ambulatory Visit

## 2021-08-30 ENCOUNTER — Other Ambulatory Visit: Payer: Self-pay

## 2021-08-30 DIAGNOSIS — Z1231 Encounter for screening mammogram for malignant neoplasm of breast: Secondary | ICD-10-CM

## 2022-08-10 ENCOUNTER — Other Ambulatory Visit: Payer: Self-pay | Admitting: Obstetrics & Gynecology

## 2022-08-10 DIAGNOSIS — Z1231 Encounter for screening mammogram for malignant neoplasm of breast: Secondary | ICD-10-CM

## 2022-09-15 ENCOUNTER — Ambulatory Visit: Payer: BC Managed Care – PPO

## 2022-10-27 ENCOUNTER — Ambulatory Visit: Payer: BC Managed Care – PPO

## 2022-12-19 ENCOUNTER — Ambulatory Visit
Admission: RE | Admit: 2022-12-19 | Discharge: 2022-12-19 | Disposition: A | Discharge status: Home / Self Care | Payer: BC Managed Care – PPO | Source: Ambulatory Visit | Attending: Obstetrics & Gynecology | Admitting: Obstetrics & Gynecology

## 2022-12-19 DIAGNOSIS — Z1231 Encounter for screening mammogram for malignant neoplasm of breast: Secondary | ICD-10-CM

## 2023-06-22 IMAGING — MG MM DIGITAL SCREENING BILAT W/ TOMO AND CAD
8 series · 8 of 24 positions shown · non-contrast
Comparison: Previous exam(s).

CLINICAL DATA: Screening.

EXAM:
DIGITAL SCREENING BILATERAL MAMMOGRAM WITH TOMOSYNTHESIS AND CAD
TECHNIQUE: Bilateral screening digital craniocaudal and mediolateral oblique
mammograms were obtained. Bilateral screening digital breast
tomosynthesis was performed. The images were evaluated with
computer-aided detection.

[R MLO synth-2D]
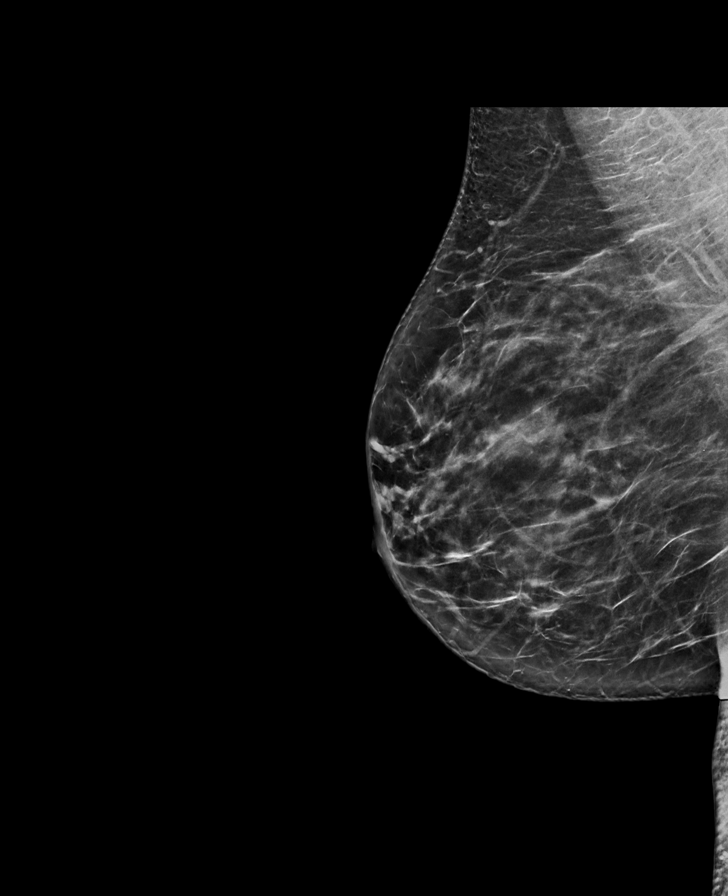

[L CC synth-2D]
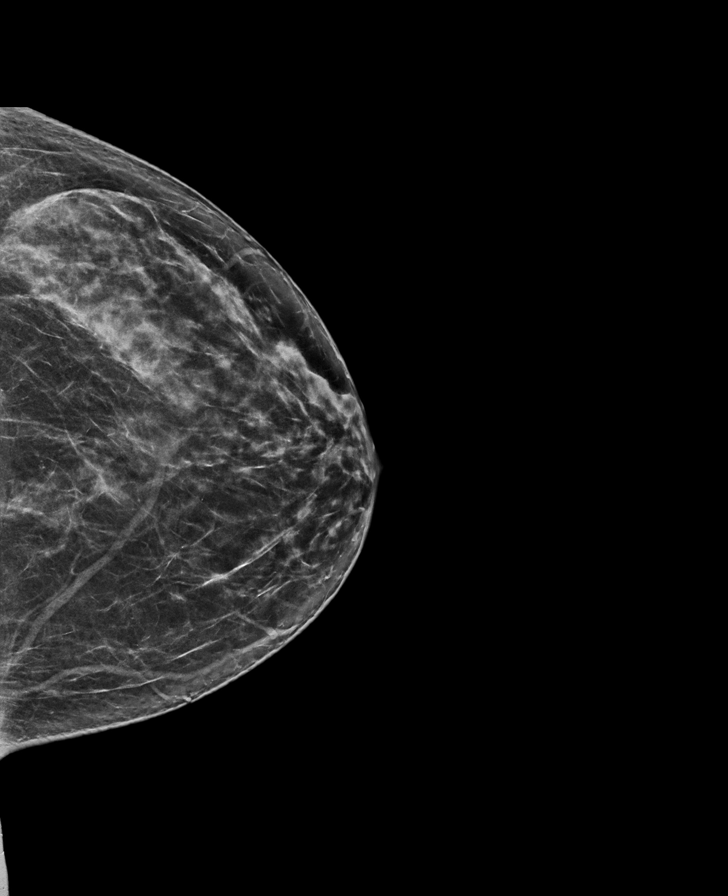

[L MLO synth-2D]
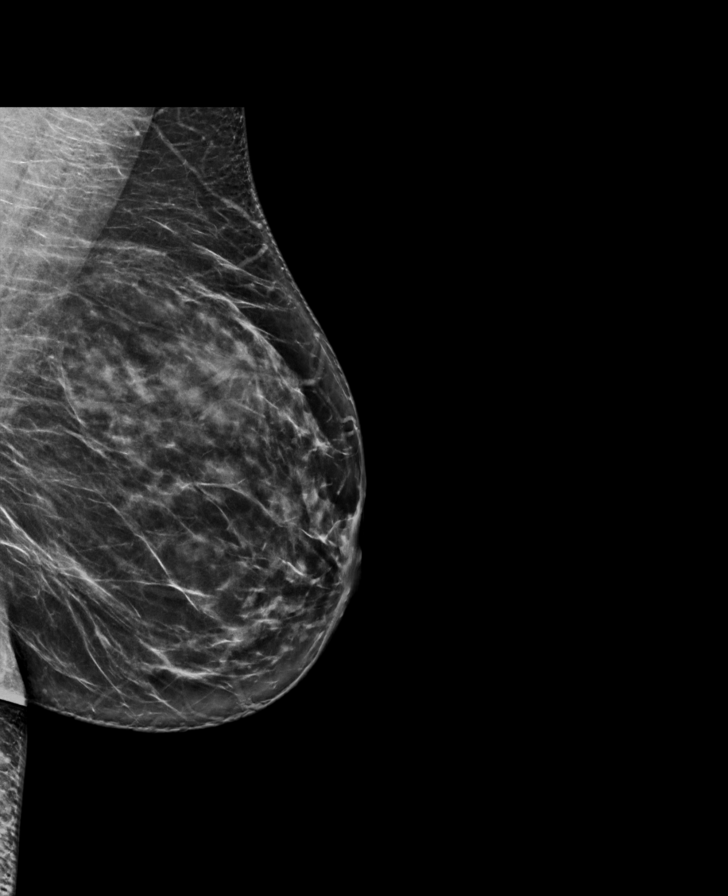

[R CC synth-2D]
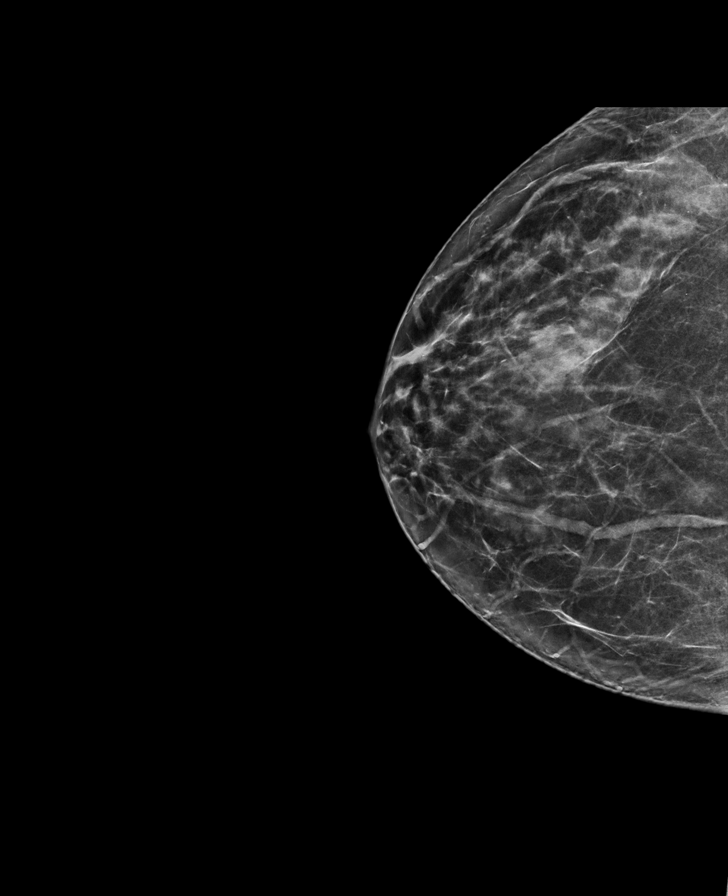

[R CC tomo · tomo slice 36/71.0]
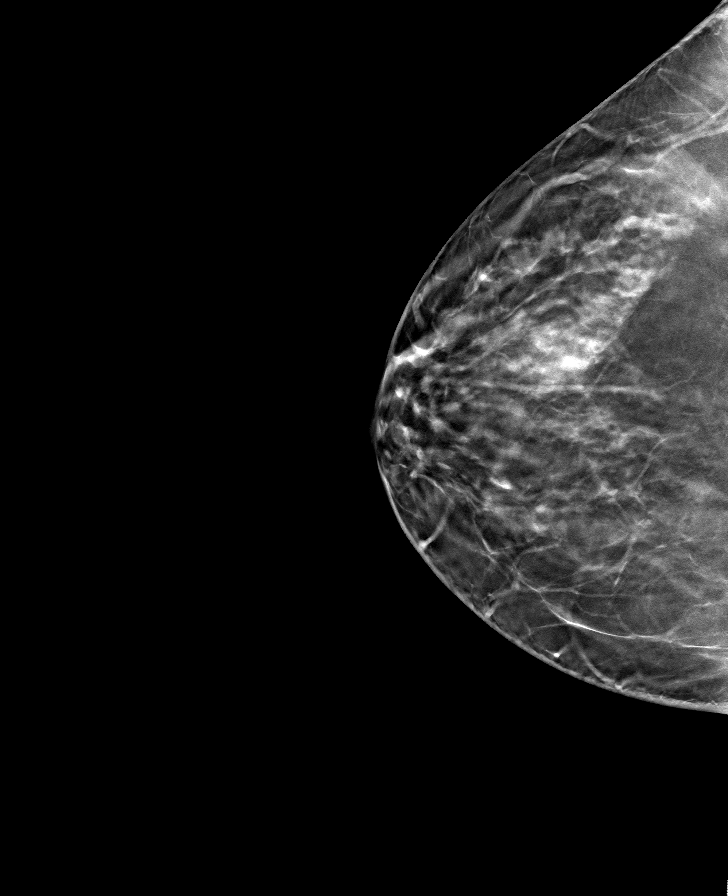

[R MLO tomo · tomo slice 39/77.0]
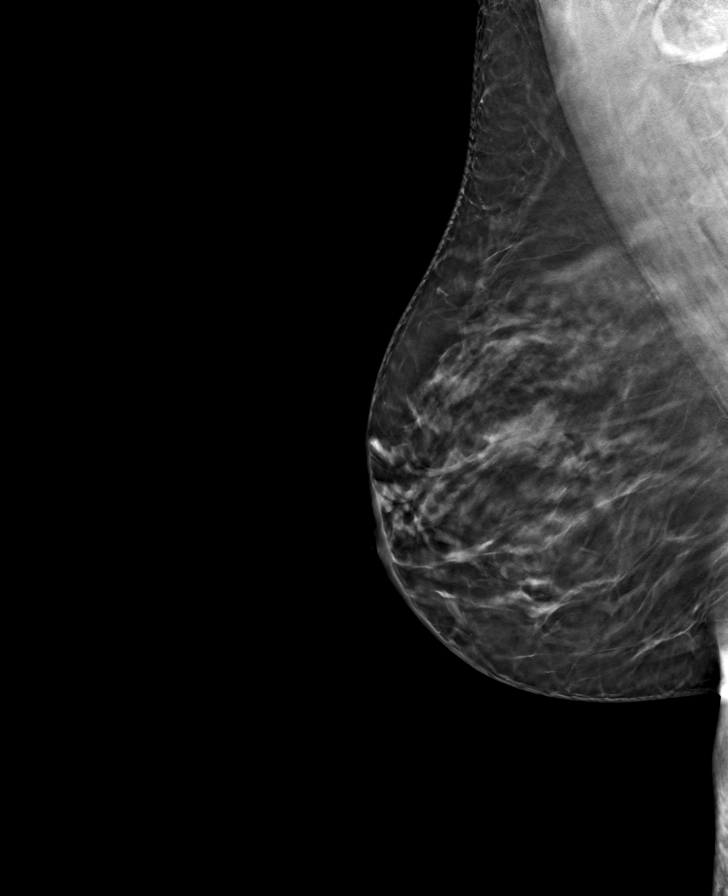

[L MLO tomo · tomo slice 38/75.0]
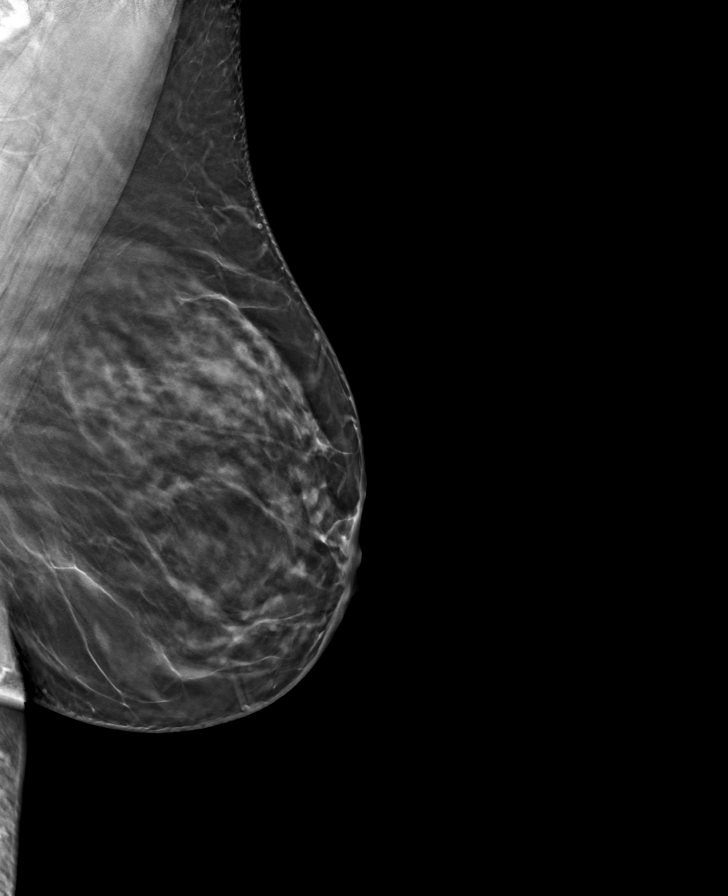

[L CC tomo · tomo slice 35/68.0]
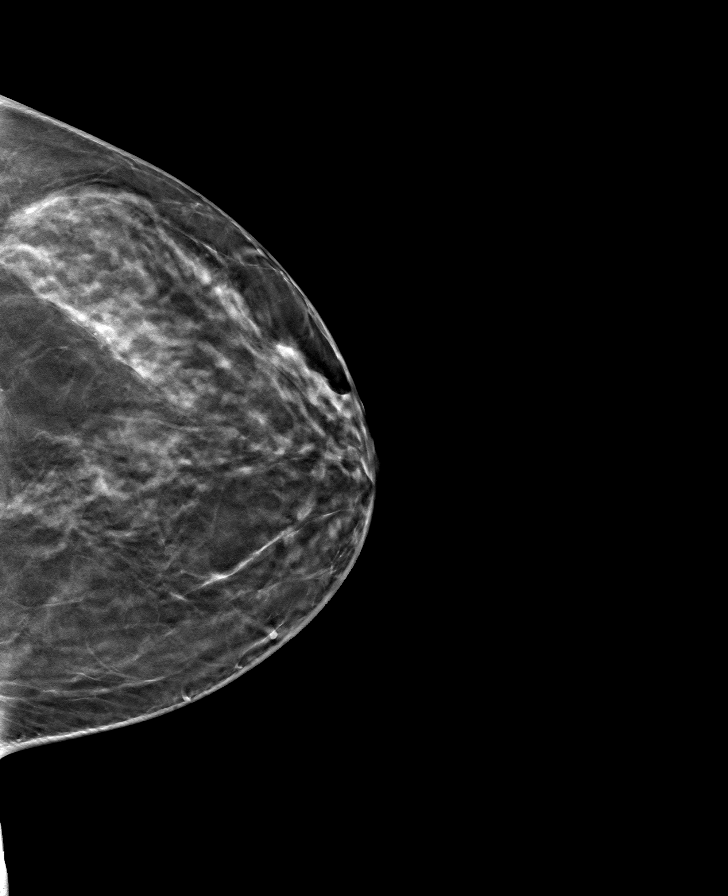

[8 of 24 positions shown; findings below may reference images not displayed]

ACR Breast Density Category c: The breast tissue is heterogeneously
dense, which may obscure small masses.
FINDINGS: There are no findings suspicious for malignancy.
IMPRESSION: No mammographic evidence of malignancy. A result letter of this
screening mammogram will be mailed directly to the patient.

RECOMMENDATION:
Screening mammogram in one year. (Code:Q3-W-BC3)

BI-RADS CATEGORY  1: Negative.
